# Patient Record
Sex: Male | Born: 1960 | Race: Black or African American | Hispanic: No | Marital: Married | Smoking: Never smoker
Health system: Southern US, Community
[De-identification: ages and names within clinical notes are randomized; demographics above are authoritative.]

## PROBLEM LIST (undated history)

## (undated) DIAGNOSIS — K219 Gastro-esophageal reflux disease without esophagitis: Secondary | ICD-10-CM

## (undated) DIAGNOSIS — J189 Pneumonia, unspecified organism: Secondary | ICD-10-CM

## (undated) DIAGNOSIS — Z8719 Personal history of other diseases of the digestive system: Secondary | ICD-10-CM

## (undated) HISTORY — PX: KNEE SURGERY: SHX244

## (undated) HISTORY — PX: TONSILLECTOMY: SUR1361

---

## 2002-10-17 ENCOUNTER — Encounter: Admission: RE | Admit: 2002-10-17 | Discharge: 2002-10-17 | Payer: Self-pay | Admitting: Gastroenterology

## 2002-10-17 ENCOUNTER — Encounter: Payer: Self-pay | Admitting: Gastroenterology

## 2005-12-20 ENCOUNTER — Encounter: Admission: RE | Admit: 2005-12-20 | Discharge: 2005-12-20 | Payer: Self-pay | Admitting: Family Medicine

## 2008-07-11 ENCOUNTER — Encounter: Admission: RE | Admit: 2008-07-11 | Discharge: 2008-07-11 | Payer: Self-pay | Admitting: Family Medicine

## 2008-11-06 ENCOUNTER — Ambulatory Visit (HOSPITAL_BASED_OUTPATIENT_CLINIC_OR_DEPARTMENT_OTHER): Admission: RE | Admit: 2008-11-06 | Discharge: 2008-11-06 | Payer: Self-pay | Admitting: Orthopedic Surgery

## 2010-10-13 NOTE — Op Note (Signed)
[name], [name] NO.:  [id number]   MEDICAL RECORD NO.:  [id number]          PATIENT TYPE:  AMB   LOCATION:  [facility]                         FACILITY:  [facility]   PHYSICIAN:  Ollen Gross, M.D.    DATE OF BIRTH:  March 14, 1961   DATE OF PROCEDURE:  11/06/2008  DATE OF DISCHARGE:                               OPERATIVE REPORT   PREOPERATIVE DIAGNOSIS:  Right knee chondral defect.   POSTOPERATIVE DIAGNOSIS:  Right knee chondral defect, meniscal tear.   PROCEDURE:  Right knee arthroscopy with medial meniscal debridement and  chondroplasty, medial femoral condyle.   SURGEON:  Ollen Gross, M.D.   ASSISTANT:  None.   ANESTHESIA:  General.   ESTIMATED BLOOD LOSS:  Minimal.   DRAINS:  None.   COMPLICATIONS:  None.   CONDITION:  Stable to recovery.   CLINICAL NOTE:  Donald Miller is a 50 year old male with a several-month history  of significant right knee pain and mechanical symptoms.  He has had  recurrent effusions.  When I initially saw him he had a large effusion.  We did an aspiration and injection which only provided partial temporary  relief.  His exam and history suggest an intra-articular knee process.  MRI was inconclusive.  Given his persistent pain, mechanical symptoms,  and effusion.  He presents now for arthroscopic evaluation and  debridement.   PROCEDURE IN DETAIL:  After successful administration of general  anesthetic, tourniquet placed high on the right thigh and right lower  extremity prepped and draped in the usual sterile fashion.  Standard  superomedial and inferolateral incisions made.  In flow cannula passed  superomedial and camera passed inferolateral.  Arthroscopic  visualization proceeds.  Undersurface of the patella has minimal  chondromalacia and the trochlea has very mild chondromalacia also.  These were both grade 1 at most.  There is no full-thickness chondral  defects or unstable cartilage.  The mediolateral gutters were  visualized  and there are no loose bodies.  Flexion and valgus force applied to the  knee and the medial compartment is entered.  He does has have evidence  of a large chondral defect, central portion of the medial femoral  condyle.  There is unstable cartilage which is essentially flaking off  of the femur.  Size of defect is about 2 x 2 cm.  I debrided this back  to a stable base with a very small area of exposed bone and the rest  stable cartilaginous base and then stabilized the edges of the defect  also.  I probed and it was stable.  There is a tear in the posterior  horn of the medial meniscus.  I used a combination of baskets and 4.2 mm  shaver to debride that.  I also used the ArthroCare to seal it off.  The  intercondylar notch was visualized, ACL was normal.  Lateral compartment  was entered and it looks normal.  I inspected the rest of the joint.  No  other tears or loose bodies are noted.  The arthroscopic equipment is  then removed from the inferior portals which were closed  with  interrupted 4-0 nylon.  20 mL of 0.25% Marcaine with epi injected  through the inflow cannula and then that is removed and that portal  closed with nylon.  A bulky sterile dressing is applied and he is  awakened and transported to recovery in stable condition.      Ollen Gross, M.D.  Electronically Signed     FA/MEDQ  D:  11/06/2008  T:  11/07/2008  Job:  [id number]

## 2011-09-07 ENCOUNTER — Ambulatory Visit
Admission: RE | Admit: 2011-09-07 | Discharge: 2011-09-07 | Disposition: A | Discharge status: Home / Self Care | Payer: 59 | Source: Ambulatory Visit | Attending: Family Medicine | Admitting: Family Medicine

## 2011-09-07 ENCOUNTER — Other Ambulatory Visit: Payer: Self-pay | Admitting: Family Medicine

## 2011-09-07 DIAGNOSIS — J209 Acute bronchitis, unspecified: Secondary | ICD-10-CM

## 2015-09-10 ENCOUNTER — Ambulatory Visit (INDEPENDENT_AMBULATORY_CARE_PROVIDER_SITE_OTHER): Payer: 59 | Admitting: Family Medicine

## 2015-09-10 VITALS — BP 118/72 | HR 67 | Temp 99.6°F | Resp 18 | Ht 73.0 in | Wt 270.2 lb

## 2015-09-10 DIAGNOSIS — A084 Viral intestinal infection, unspecified: Secondary | ICD-10-CM | POA: Diagnosis not present

## 2015-09-10 MED ORDER — PROMETHAZINE HCL 12.5 MG PO TABS: 12.5000 mg | ORAL_TABLET | Freq: Three times a day (TID) | ORAL | Status: DC | PRN

## 2015-09-10 NOTE — Progress Notes (Signed)
This 55 year old gentleman who developed nausea, vomiting, and diarrhea last night. 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  We worked this morning he thought he could go back to work. He was too weak and still vomiting. He's been in the keep ice chips down for last couple hours.  Patient denies fever or lightheadedness. He's had on reconsideration that he may have a low-grade temperature. He has a mild ache around his bellybutton. There's been no blood in stool or emesis.  In general patient's been very healthy. His wife had the same illness several days ago but is now feeling better  Objective: Overweight middle-aged man in no acute distress, seen with wife. BP 118/72 mmHg  Pulse 67  Temp(Src) 99.6 F (37.6 C) (Oral)  Resp 18  Ht 6\' 1"  (1.854 m)  Wt 270 lb 3.2 oz (122.562 kg)  BMI 35.66 kg/m2  SpO2 96% HEENT: Moist mucous membranes, no acute abnormalities noted Neck: Supple Chest: Clear Heart: Regular no murmur Abdomen: Active bowel sounds, no localized tenderness, no guarding or rebound, no HSM, no masses Skin: No rashes   Viral gastroenteritis - Plan: promethazine (PHENERGAN) 12.5 MG tablet  Elvina SidleKurt Bruna Dills, MD

## 2015-09-10 NOTE — Patient Instructions (Addendum)

## 2016-03-23 ENCOUNTER — Emergency Department (HOSPITAL_COMMUNITY)
Admission: EM | Admit: 2016-03-23 | Discharge: 2016-03-23 | Disposition: A | Discharge status: Home / Self Care | Payer: Commercial Managed Care - HMO | Attending: Emergency Medicine | Admitting: Emergency Medicine

## 2016-03-23 ENCOUNTER — Encounter (HOSPITAL_COMMUNITY): Payer: Self-pay | Admitting: Emergency Medicine

## 2016-03-23 ENCOUNTER — Emergency Department (HOSPITAL_COMMUNITY): Payer: Commercial Managed Care - HMO

## 2016-03-23 DIAGNOSIS — J189 Pneumonia, unspecified organism: Secondary | ICD-10-CM | POA: Diagnosis not present

## 2016-03-23 DIAGNOSIS — R05 Cough: Secondary | ICD-10-CM | POA: Diagnosis present

## 2016-03-23 DIAGNOSIS — R600 Localized edema: Secondary | ICD-10-CM | POA: Diagnosis not present

## 2016-03-23 LAB — COMPREHENSIVE METABOLIC PANEL
ALK PHOS: 69 U/L (ref 38–126)
ALT: 19 U/L (ref 17–63)
AST: 58 U/L — ABNORMAL HIGH (ref 15–41)
Albumin: 3.8 g/dL (ref 3.5–5.0)
Anion gap: 6 (ref 5–15)
BILIRUBIN TOTAL: 0.6 mg/dL (ref 0.3–1.2)
BUN: 8 mg/dL (ref 6–20)
CALCIUM: 9.1 mg/dL (ref 8.9–10.3)
CO2: 30 mmol/L (ref 22–32)
CREATININE: 1.23 mg/dL (ref 0.61–1.24)
Chloride: 100 mmol/L — ABNORMAL LOW (ref 101–111)
Glucose, Bld: 105 mg/dL — ABNORMAL HIGH (ref 65–99)
Potassium: 3.7 mmol/L (ref 3.5–5.1)
Sodium: 136 mmol/L (ref 135–145)
TOTAL PROTEIN: 7.2 g/dL (ref 6.5–8.1)

## 2016-03-23 LAB — CBC WITH DIFFERENTIAL/PLATELET
Basophils Absolute: 0 10*3/uL (ref 0.0–0.1)
Basophils Relative: 0 %
EOS PCT: 5 %
Eosinophils Absolute: 0.4 10*3/uL (ref 0.0–0.7)
HEMATOCRIT: 39.6 % (ref 39.0–52.0)
HEMOGLOBIN: 13.6 g/dL (ref 13.0–17.0)
LYMPHS ABS: 3 10*3/uL (ref 0.7–4.0)
Lymphocytes Relative: 34 %
MCH: 28.9 pg (ref 26.0–34.0)
MCHC: 34.3 g/dL (ref 30.0–36.0)
MCV: 84.1 fL (ref 78.0–100.0)
Monocytes Absolute: 0.6 10*3/uL (ref 0.1–1.0)
Monocytes Relative: 7 %
NEUTROS ABS: 4.6 10*3/uL (ref 1.7–7.7)
NEUTROS PCT: 54 %
PLATELETS: 296 10*3/uL (ref 150–400)
RBC: 4.71 MIL/uL (ref 4.22–5.81)
RDW: 12.9 % (ref 11.5–15.5)
WBC: 8.7 10*3/uL (ref 4.0–10.5)

## 2016-03-23 MED ORDER — BENZONATATE 100 MG PO CAPS
200.0000 mg | ORAL_CAPSULE | Freq: Once | ORAL | Status: AC
  Administered 2016-03-23: 200 mg via ORAL
  Filled 2016-03-23: qty 2

## 2016-03-23 MED ORDER — AZITHROMYCIN 250 MG PO TABS
500.0000 mg | ORAL_TABLET | Freq: Once | ORAL | Status: AC
  Administered 2016-03-23: 500 mg via ORAL
  Filled 2016-03-23: qty 2

## 2016-03-23 MED ORDER — AZITHROMYCIN 250 MG PO TABS: 250.0000 mg | ORAL_TABLET | Freq: Every day | ORAL | 0 refills | Status: DC

## 2016-03-23 MED ORDER — IPRATROPIUM-ALBUTEROL 0.5-2.5 (3) MG/3ML IN SOLN
3.0000 mL | Freq: Once | RESPIRATORY_TRACT | Status: AC
  Administered 2016-03-23: 3 mL via RESPIRATORY_TRACT
  Filled 2016-03-23: qty 3

## 2016-03-23 NOTE — ED Provider Notes (Signed)
[facility]-EMERGENCY DEPT Provider Note   CSN: [id number] Arrival date & time: 03/23/16  0224  By signing my name below, I, Christy Sartorius, attest that this documentation has been prepared under the direction and in the presence of Tomasita Crumble, MD . Electronically Signed: Christy Sartorius, Scribe. 03/23/2016. 3:30 AM.  History   Chief Complaint Chief Complaint  Patient presents with  . Cough  . Leg Pain  . Loss of Consciousness   The history is provided by the patient and medical records. No language interpreter was used.  Leg Pain   Associated symptoms include numbness.  Loss of Consciousness   Pertinent negatives include back pain and vomiting.     HPI Comments:  Donald Miller is a 55 y.o. male who presents to the Emergency Department complaining of an episode of syncope yesterday night.  Pt's wife states that he woke her up complaining of a burning sensation in his right upper thigh.  He then toppled out of bed and was unresponsive for 30 seconds then slowly aroused.  Pt reports that yesterday he began having some pains in his right leg.    He denies pain at this time but notes numbness in his anterior thigh, but none along the medial or lateral aspects.  He also reports that he has had a cough for the last week.  He got a flu shot 2 weeks ago and his coworker has had a similar cough.  He has been drinking sufficient water.  Pt denies back pain, changes in diet, vomiting and diarrhea.   History reviewed. No pertinent past medical history.  There are no active problems to display for this patient.   Past Surgical History:  Procedure Laterality Date  . KNEE SURGERY         Home Medications    Prior to Admission medications   Medication Sig Start Date End Date Taking? Authorizing Provider  promethazine (PHENERGAN) 12.5 MG tablet Take 1 tablet (12.5 mg total) by mouth every 8 (eight) hours as needed for nausea or vomiting. Patient not taking: Reported on 03/23/2016  09/10/15   Elvina Sidle, MD    Family History No family history on file.  Social History Social History  Substance Use Topics  . Smoking status: Never Smoker  . Smokeless tobacco: Never Used  . Alcohol use No     Allergies   Review of patient's allergies indicates no known allergies.   Review of Systems Review of Systems  Constitutional: Negative for appetite change.  Respiratory: Positive for cough.   Cardiovascular: Positive for syncope. Negative for leg swelling.  Gastrointestinal: Negative for diarrhea and vomiting.  Musculoskeletal: Positive for myalgias. Negative for back pain.  Skin: Positive for pallor.  Neurological: Positive for syncope and numbness.  Hematological: Bruises/bleeds easily.  All other systems reviewed and are negative.    Physical Exam Updated Vital Signs BP (!) 99/53   Pulse (!) 57   Temp 98.1 F (36.7 C) (Oral)   Resp 16   SpO2 92%   Physical Exam  Constitutional: He is oriented to person, place, and time. Vital signs are normal. He appears well-developed and well-nourished.  Non-toxic appearance. He does not appear ill. No distress.  HENT:  Head: Normocephalic and atraumatic.  Nose: Nose normal.  Mouth/Throat: Oropharynx is clear and moist. No oropharyngeal exudate.  Eyes: Conjunctivae and EOM are normal. Pupils are equal, round, and reactive to light. No scleral icterus.  Neck: Normal range of motion. Neck supple. No tracheal deviation, no edema,  no erythema and normal range of motion present. No thyroid mass and no thyromegaly present.  Cardiovascular: Normal rate, regular rhythm, S1 normal, S2 normal, normal heart sounds, intact distal pulses and normal pulses.  Exam reveals no gallop and no friction rub.   No murmur heard. Pulmonary/Chest: Effort normal and breath sounds normal. No respiratory distress. He has no wheezes. He has no rhonchi. He has no rales.  Abdominal: Soft. Normal appearance and bowel sounds are normal. He  exhibits no distension, no ascites and no mass. There is no hepatosplenomegaly. There is no tenderness. There is no rebound, no guarding and no CVA tenderness.  Musculoskeletal: Normal range of motion. He exhibits edema. He exhibits no tenderness.  Trace BLE edema.  Lymphadenopathy:    He has no cervical adenopathy.  Neurological: He is alert and oriented to person, place, and time. He has normal strength. No cranial nerve deficit or sensory deficit.  Skin: Skin is warm, dry and intact. No petechiae and no rash noted. He is not diaphoretic. No erythema. No pallor.  Nursing note and vitals reviewed.   ED Treatments / Results   DIAGNOSTIC STUDIES:  Oxygen Saturation is 100% on RA, NML by my interpretation.    COORDINATION OF CARE:  3:26 AM Discussed treatment plan with pt at bedside and pt agreed to plan.  Labs (all labs ordered are listed, but only abnormal results are displayed) Labs Reviewed  COMPREHENSIVE METABOLIC PANEL - Abnormal; Notable for the following:       Result Value   Chloride 100 (*)    Glucose, Bld 105 (*)    AST 58 (*)    All other components within normal limits  CBC WITH DIFFERENTIAL/PLATELET    EKG  EKG Interpretation  Date/Time:  Tuesday March 23 2016 03:09:48 EDT Ventricular Rate:  50 PR Interval:    QRS Duration: 86 QT Interval:  406 QTC Calculation: 371 R Axis:   78 Text Interpretation:  Sinus rhythm Baseline wander in lead(s) I III aVL aVF No significant change since last tracing Confirmed by Erroll Luna [phone]) on 03/23/2016 3:39:59 AM       Radiology Dg Chest 2 View  Result Date: 03/23/2016 CLINICAL DATA:  Productive cough for 1 week.  Burning thigh pain. EXAM: CHEST  2 VIEW COMPARISON:  Chest radiograph September 07, 2011 FINDINGS: Cardiac silhouette is upper limits of normal in size. Mediastinal silhouette is nonsuspicious. Mild bronchitic changes. Patchy bibasilar airspace opacities without pleural effusion. No pneumothorax. Soft  tissue planes and included osseous structures are nonsuspicious. IMPRESSION: Bronchitic changes with bibasilar atelectasis, less likely pneumonia. Borderline cardiomegaly. Electronically Signed   By: Awilda Metro M.D.   On: 03/23/2016 04:00    Procedures Procedures (including critical care time)  Medications Ordered in ED Medications  benzonatate (TESSALON) capsule 200 mg (200 mg Oral Given 03/23/16 0312)  ipratropium-albuterol (DUONEB) 0.5-2.5 (3) MG/3ML nebulizer solution 3 mL (3 mLs Nebulization Given 03/23/16 0332)  azithromycin (ZITHROMAX) tablet 500 mg (500 mg Oral Given 03/23/16 0454)     Initial Impression / Assessment and Plan / ED Course  I have reviewed the triage vital signs and the nursing notes.  Pertinent labs & imaging results that were available during my care of the patient were reviewed by me and considered in my medical decision making (see chart for details).  Clinical Course    Patient presents to the ED for coughing and subsequent syncope, likely a vagal episode.  Labs are unremarkable. He was given duoneb and  tessalon pearles for cough.  CXR reveals possible pneumonia, plan to treat in this setting with azithromycin.  He appears well and in NAD. VS remain within his normal limits and he is safe for Dc.  Final Clinical Impressions(s) / ED Diagnoses   Final diagnoses:  Community acquired pneumonia, unspecified laterality    New Prescriptions New Prescriptions   No medications on file     I personally performed the services described in this documentation, which was scribed in my presence. The recorded information has been reviewed and is accurate.       Tomasita CrumbleAdeleke Newman Waren, MD 03/23/16 [phone]

## 2016-03-23 NOTE — ED Triage Notes (Addendum)
Patient reports persistent productive cough for 1 week , right thigh pain " burning " yesterday and brief syncopal episode while coughing yesterday .

## 2017-02-14 DIAGNOSIS — K429 Umbilical hernia without obstruction or gangrene: Secondary | ICD-10-CM | POA: Diagnosis not present

## 2017-03-02 DIAGNOSIS — K429 Umbilical hernia without obstruction or gangrene: Secondary | ICD-10-CM | POA: Diagnosis not present

## 2017-03-02 DIAGNOSIS — M6208 Separation of muscle (nontraumatic), other site: Secondary | ICD-10-CM | POA: Diagnosis not present

## 2017-03-09 DIAGNOSIS — J209 Acute bronchitis, unspecified: Secondary | ICD-10-CM | POA: Diagnosis not present

## 2017-03-16 NOTE — Progress Notes (Signed)
Please place orders in EPIC as patient is being scheduled for a pre-op appointment! Thank you! 

## 2017-03-18 ENCOUNTER — Ambulatory Visit: Payer: Self-pay | Admitting: General Surgery

## 2017-03-18 NOTE — H&P (Signed)
Donald Miller 03/02/2017 10:30 AM Location: [facility] Patient #: [id number] DOB: [birth year] Married / Language: English / Race: Black or African American Male   History of Present Illness Minerva Areola M. Liyat Faulkenberry MD; 03/03/2017 9:21 AM) The patient is a 56 year old male who presents with an abdominal wall hernia. He is referred by Dr Paulino Rily for evaluation of abdominal wall hernia. He states a few weeks ago he had an episode of where he had discomfort around his umbilicus. It was more on the right side. He had no nausea or vomiting with it. It occurred after having a large bowel movement. He states the area just ached for a few days. He noticed nothing protruding through his umbilicus. It was tender where it was sore. He denies any prior abdominal surgery. He generally has a daily bowel movement. He denies any weight loss. He denies smoking. He denies any chest pain, chest pressure, shortness of breath, dyspnea on exertion and orthopnea   Problem List/Past Medical 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  Diagnostic Studies History Christianne Dolin, [place]; 03/02/2017 10:31 AM) Colonoscopy  within last year  Allergies Christianne Dolin, RMA; 03/02/2017 10:32 AM) No Known Allergies 03/02/2017  Medication History Christianne Dolin, RMA; 03/02/2017 10:32 AM) Acetaminophen (Oral as needed) Specific strength unknown - Active. Medications Reconciled  Social History Christianne Dolin, [place]; 03/02/2017 10:31 AM) Alcohol use  Occasional alcohol use. Caffeine use  Carbonated beverages. No drug use  Tobacco use  Never smoker.  Family History Christianne Dolin, [place]; 03/02/2017 10:31 AM) Diabetes Mellitus  Mother.  Other Problems [name], MD; 03/03/2017 9:22  AM) Gastroesophageal Reflux Disease     Review of Systems Christianne Dolin RMA; 03/02/2017 10:31 AM) Skin Not Present- Change in Wart/Mole, Dryness, Hives, Jaundice, New Lesions, Non-Healing Wounds, Rash and Ulcer. HEENT Present- Wears glasses/contact lenses. Not Present- Earache, Hearing Loss, Hoarseness, Nose Bleed, Oral Ulcers, Ringing in the Ears, Seasonal Allergies, Sinus Pain, Sore Throat, Visual Disturbances and Yellow Eyes. Respiratory Not Present- Bloody sputum, Chronic Cough, Difficulty Breathing, Snoring and Wheezing. Breast Not Present- Breast Mass, Breast Pain, Nipple Discharge and Skin Changes. Cardiovascular Not Present- Chest Pain, Difficulty Breathing Lying Down, Leg Cramps, Palpitations, Rapid Heart Rate, Shortness of Breath and Swelling of Extremities. Gastrointestinal Present- Abdominal Pain. Not Present- Bloating, Bloody Stool, Change in Bowel Habits, Chronic diarrhea, Constipation, Difficulty Swallowing, Excessive gas, Gets full quickly at meals, Hemorrhoids, Indigestion, Nausea, Rectal Pain and Vomiting. Male Genitourinary Not Present- Blood in Urine, Change in Urinary Stream, Frequency, Impotence, Nocturia, Painful Urination, Urgency and Urine Leakage. Musculoskeletal Present- Joint Pain. Not Present- Back Pain, Joint Stiffness, Muscle Pain, Muscle Weakness and Swelling of Extremities. Neurological Not Present- Decreased Memory, Fainting, Headaches, Numbness, Seizures, Tingling, Tremor, Trouble walking and Weakness. Psychiatric Not Present- Anxiety, Bipolar, Change in Sleep Pattern, Depression, Fearful and Frequent crying. Endocrine Not Present- Cold Intolerance, Excessive Hunger, Hair Changes, Heat Intolerance, Hot flashes and New Diabetes. Hematology Not Present- Blood Thinners, Easy Bruising, Excessive bleeding, Gland problems, HIV and Persistent Infections.  Vitals Christianne Dolin RMA; 03/02/2017 10:33 AM) 03/02/2017 10:32 AM Weight: 286.4 lb Height: 71in Body  Surface Area: 2.46 m Body Mass Index: 39.94 kg/m  Temp.: 108F  Pulse: 62 (Regular)  BP: 126/84 (Sitting, Left Arm, Standard)       Physical Exam Minerva Areola M.  Treveon Bourcier MD; 03/03/2017 9:20 AM) General Mental Status-Alert. General Appearance-Consistent with stated age. Hydration-Well hydrated. Voice-Normal. Note: morbidly obese; central truncal   Head and Neck Head-normocephalic, atraumatic with no lesions or palpable masses. Trachea-midline. Thyroid Gland Characteristics - normal size and consistency.  Eye Eyeball - Bilateral-Extraocular movements intact. Sclera/Conjunctiva - Bilateral-No scleral icterus.  Chest and Lung Exam Chest and lung exam reveals -quiet, even and easy respiratory effort with no use of accessory muscles and on auscultation, normal breath sounds, no adventitious sounds and normal vocal resonance. Inspection Chest Wall - Normal. Back - normal.  Breast - Did not examine.  Cardiovascular Cardiovascular examination reveals -normal heart sounds, regular rate and rhythm with no murmurs and normal pedal pulses bilaterally.  Abdomen Inspection  Inspection of the abdomen reveals: Note: upper midline to periumbilical diastasis fasical defect at umbilicus - 1.5-2 cm. soft, reducible,. Skin - Scar - no surgical scars. Palpation/Percussion Palpation and Percussion of the abdomen reveal - Soft, Non Tender, No Rebound tenderness, No Rigidity (guarding) and No hepatosplenomegaly. Auscultation Auscultation of the abdomen reveals - Bowel sounds normal.  Peripheral Vascular Upper Extremity Palpation - Pulses bilaterally normal.  Neurologic Neurologic evaluation reveals -alert and oriented x 3 with no impairment of recent or remote memory. Mental Status-Normal.  Neuropsychiatric The patient's mood and affect are described as -normal. Judgment and Insight-insight is appropriate concerning matters relevant to  self.  Musculoskeletal Normal Exam - Left-Upper Extremity Strength Normal and Lower Extremity Strength Normal. Normal Exam - Right-Upper Extremity Strength Normal and Lower Extremity Strength Normal.  Lymphatic Head & Neck  General Head & Neck Lymphatics: Bilateral - Description - Normal. Axillary - Did not examine. Femoral & Inguinal - Did not examine.    Assessment & Plan Minerva Areola(Chikita Dogan M. 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 We discussed the signs and symptoms of incarceration and strangulation. The patient was given educational material. I also drew diagrams.  We discussed nonoperative and operative management. With respect to operative management, we discussed open repair  We discussed the risk and benefits of surgery including but not limited to bleeding, infection, injury to surrounding structures, hernia recurrence, mesh complications, hematoma/seroma formation, blood clot formation, urinary retention, post operative ileus, general anesthesia risk, abdominal pain. We discussed the importance of avoiding heavy lifting and straining for a period of 4- 6 weeks.  The patient has elected to proceed with OPEN REPAIR OF UMBILICAL HERNIA, POSSIBLE MESH. Current Plans Pt Education - Pamphlet Given - Hernia Surgery: discussed with patient and provided information. DIASTASIS RECTI (M62.08) Impression: We discussed abdominal wall diastases. We discussed that it is not a true hernia but rather a thinning of the abdominal wall muscles. He was given Agricultural engineereducational material. I did not recommend surgical correction given his current body weight and since he is asymptomatic. Current Plans Pt Education - EMW_diastasis   Mary Sellaric M. Andrey CampanileWilson, MD, FACS General, Bariatric, & Minimally Invasive [facility] [place]

## 2017-03-24 NOTE — Patient Instructions (Addendum)
Emi Holesnthony B Haeberle  03/24/2017   Your procedure is scheduled on: 04-01-17    Report to [facility] Main  Entrance [place]  elevators to 3rd floor to  Short Stay Center at 1:30PM.    Call this number if you have problems the morning of surgery [phone]    Remember: ONLY 1 PERSON MAY GO WITH YOU TO SHORT STAY TO GET  READY MORNING OF YOUR SURGERY.    Do not eat food After Midnight. YOU MAY HAVE CLEAR LIQUIDS FROM MIDNIGHT UNTIL 930AM DAY OF SURGERY. NOTHING BY MOUTH AFTER 930AM!     Take these medicines the morning of surgery with A SIP OF WATER: TYLENOL IF NEEDED, ZYRTEC                                You may not have any metal on your body including hair pins and              piercings  Do not wear jewelry, make-up, lotions, powders or perfumes, deodorant              Men may shave face and neck.   Do not bring valuables to the hospital. [facility] IS NOT             RESPONSIBLE   FOR VALUABLES.  Contacts, dentures or bridgework may not be worn into surgery.  Leave suitcase in the car. After surgery it may be brought to your room.                Please read over the following fact sheets you were given: _____________________________________________________________________    CLEAR LIQUID DIET   Foods Allowed                                                                     Foods Excluded  Coffee and tea, regular and decaf                             liquids that you cannot  Plain Jell-O in any flavor                                             see through such as: Fruit ices (not with fruit pulp)                                     milk, soups, orange juice  Iced Popsicles                                    All solid food Carbonated beverages, regular and diet  Cranberry, grape and apple juices Sports drinks like Gatorade Lightly seasoned clear broth or consume(fat free) Sugar, honey syrup  Sample  Menu Breakfast                                Lunch                                     Supper Cranberry juice                    Beef broth                            Chicken broth Jell-O                                     Grape juice                           Apple juice Coffee or tea                        Jell-O                                      Popsicle                                                Coffee or tea                        Coffee or tea  _____________________________________________________________________             [facility] - Preparing for Surgery Before surgery, you can play an important role.  Because skin is not sterile, your skin needs to be as free of germs as possible.  You can reduce the number of germs on your skin by washing with CHG (chlorahexidine gluconate) soap before surgery.  CHG is an antiseptic cleaner which kills germs and bonds with the skin to continue killing germs even after washing. Please DO NOT use if you have an allergy to CHG or antibacterial soaps.  If your skin becomes reddened/irritated stop using the CHG and inform your nurse when you arrive at Short Stay. Do not shave (including legs and underarms) for at least 48 hours prior to the first CHG shower.  You may shave your face/neck. Please follow these instructions carefully:  1.  Shower with CHG Soap the night before surgery and the  morning of Surgery.  2.  If you choose to wash your hair, wash your hair first as usual with your  normal  shampoo.  3.  After you shampoo, rinse your hair and body thoroughly to remove the  shampoo.                           4.  Use CHG as you would any other liquid soap.  You can apply chg  directly  to the skin and wash                       Gently with a scrungie or clean washcloth.  5.  Apply the CHG Soap to your body ONLY FROM THE NECK DOWN.   Do not use on face/ open                           Wound or open sores. Avoid contact with eyes, ears mouth  and genitals (private parts).                       Wash face,  Genitals (private parts) with your normal soap.             6.  Wash thoroughly, paying special attention to the area where your surgery  will be performed.  7.  Thoroughly rinse your body with warm water from the neck down.  8.  DO NOT shower/wash with your normal soap after using and rinsing off  the CHG Soap.                9.  Pat yourself dry with a clean towel.            10.  Wear clean pajamas.            11.  Place clean sheets on your bed the night of your first shower and do not  sleep with pets. Day of Surgery : Do not apply any lotions/deodorants the morning of surgery.  Please wear clean clothes to the hospital/surgery center.  FAILURE TO FOLLOW THESE INSTRUCTIONS MAY RESULT IN THE CANCELLATION OF YOUR SURGERY PATIENT SIGNATURE_________________________________  NURSE SIGNATURE__________________________________  ________________________________________________________________________

## 2017-03-29 ENCOUNTER — Encounter (HOSPITAL_COMMUNITY)
Admission: RE | Admit: 2017-03-29 | Discharge: 2017-03-29 | Disposition: A | Discharge status: Home / Self Care | Payer: 59 | Source: Ambulatory Visit | Attending: General Surgery | Admitting: General Surgery

## 2017-03-29 ENCOUNTER — Encounter (INDEPENDENT_AMBULATORY_CARE_PROVIDER_SITE_OTHER): Payer: Self-pay

## 2017-03-29 ENCOUNTER — Encounter (HOSPITAL_COMMUNITY): Payer: Self-pay

## 2017-03-29 DIAGNOSIS — K439 Ventral hernia without obstruction or gangrene: Secondary | ICD-10-CM | POA: Diagnosis present

## 2017-03-29 DIAGNOSIS — Z6841 Body Mass Index (BMI) 40.0 and over, adult: Secondary | ICD-10-CM | POA: Diagnosis not present

## 2017-03-29 DIAGNOSIS — K429 Umbilical hernia without obstruction or gangrene: Secondary | ICD-10-CM | POA: Diagnosis not present

## 2017-03-29 HISTORY — DX: Gastro-esophageal reflux disease without esophagitis: K21.9

## 2017-03-29 HISTORY — DX: Personal history of other diseases of the digestive system: Z87.19

## 2017-03-29 HISTORY — DX: Pneumonia, unspecified organism: J18.9

## 2017-03-29 LAB — CBC
HEMATOCRIT: 41.7 % (ref 39.0–52.0)
HEMOGLOBIN: 14.2 g/dL (ref 13.0–17.0)
MCH: 29 pg (ref 26.0–34.0)
MCHC: 34.1 g/dL (ref 30.0–36.0)
MCV: 85.3 fL (ref 78.0–100.0)
Platelets: 236 10*3/uL (ref 150–400)
RBC: 4.89 MIL/uL (ref 4.22–5.81)
RDW: 13.2 % (ref 11.5–15.5)
WBC: 7.1 10*3/uL (ref 4.0–10.5)

## 2017-03-29 NOTE — Progress Notes (Signed)
   03/29/17 0813  OBSTRUCTIVE SLEEP APNEA  Have you ever been diagnosed with sleep apnea through a sleep study? No  Do you snore loudly (loud enough to be heard through closed doors)?  1  Do you often feel tired, fatigued, or sleepy during the daytime (such as falling asleep during driving or talking to someone)? 0  Has anyone observed you stop breathing during your sleep? 0  Do you have, or are you being treated for high blood pressure? 0  BMI more than 35 kg/m2? 1  Age > 50 (1-yes) 1  Neck circumference greater than:Male 16 inches or larger, Male 17inches or larger? 1  Male Gender (Yes=1) 1  Obstructive Sleep Apnea Score 5

## 2017-03-31 MED ORDER — DEXTROSE 5 % IV SOLN
3.0000 g | INTRAVENOUS | Status: AC
  Administered 2017-04-01: 3 g via INTRAVENOUS
  Filled 2017-03-31: qty 3

## 2017-04-01 ENCOUNTER — Encounter (HOSPITAL_COMMUNITY)
Admission: RE | Disposition: A | Discharge status: Home / Self Care | Payer: Self-pay | Source: Ambulatory Visit | Attending: General Surgery

## 2017-04-01 ENCOUNTER — Ambulatory Visit (HOSPITAL_COMMUNITY): Payer: 59 | Admitting: Anesthesiology

## 2017-04-01 ENCOUNTER — Ambulatory Visit (HOSPITAL_COMMUNITY)
Admission: RE | Admit: 2017-04-01 | Discharge: 2017-04-01 | Disposition: A | Discharge status: Home / Self Care | Payer: 59 | Source: Ambulatory Visit | Attending: General Surgery | Admitting: General Surgery

## 2017-04-01 ENCOUNTER — Encounter (HOSPITAL_COMMUNITY): Payer: Self-pay | Admitting: Anesthesiology

## 2017-04-01 DIAGNOSIS — K429 Umbilical hernia without obstruction or gangrene: Secondary | ICD-10-CM | POA: Insufficient documentation

## 2017-04-01 DIAGNOSIS — Z6841 Body Mass Index (BMI) 40.0 and over, adult: Secondary | ICD-10-CM | POA: Insufficient documentation

## 2017-04-01 DIAGNOSIS — K219 Gastro-esophageal reflux disease without esophagitis: Secondary | ICD-10-CM | POA: Diagnosis not present

## 2017-04-01 HISTORY — PX: UMBILICAL HERNIA REPAIR: SHX196

## 2017-04-01 HISTORY — PX: INSERTION OF MESH: SHX5868

## 2017-04-01 SURGERY — REPAIR, HERNIA, UMBILICAL, ADULT
Anesthesia: General | Site: Abdomen

## 2017-04-01 MED ORDER — CHLORHEXIDINE GLUCONATE CLOTH 2 % EX PADS: 6.0000 | MEDICATED_PAD | Freq: Once | CUTANEOUS | Status: DC

## 2017-04-01 MED ORDER — OXYCODONE HCL 5 MG PO TABS: 5.0000 mg | ORAL_TABLET | Freq: Four times a day (QID) | ORAL | 0 refills | Status: AC | PRN

## 2017-04-01 MED ORDER — PROMETHAZINE HCL 25 MG/ML IJ SOLN: 6.2500 mg | INTRAMUSCULAR | Status: DC | PRN

## 2017-04-01 MED ORDER — PROPOFOL 10 MG/ML IV BOLUS
INTRAVENOUS | Status: AC
  Filled 2017-04-01: qty 20

## 2017-04-01 MED ORDER — ACETAMINOPHEN 500 MG PO TABS
1000.0000 mg | ORAL_TABLET | ORAL | Status: AC
  Administered 2017-04-01: 1000 mg via ORAL
  Filled 2017-04-01: qty 2

## 2017-04-01 MED ORDER — MIDAZOLAM HCL 2 MG/2ML IJ SOLN: 0.5000 mg | Freq: Once | INTRAMUSCULAR | Status: DC | PRN

## 2017-04-01 MED ORDER — SUCCINYLCHOLINE CHLORIDE 200 MG/10ML IV SOSY
PREFILLED_SYRINGE | INTRAVENOUS | Status: AC
  Filled 2017-04-01: qty 10

## 2017-04-01 MED ORDER — PROPOFOL 10 MG/ML IV BOLUS
INTRAVENOUS | Status: DC | PRN
  Administered 2017-04-01: 200 mg via INTRAVENOUS

## 2017-04-01 MED ORDER — PHENYLEPHRINE 40 MCG/ML (10ML) SYRINGE FOR IV PUSH (FOR BLOOD PRESSURE SUPPORT)
PREFILLED_SYRINGE | INTRAVENOUS | Status: AC
Start: 2017-04-01 — End: ?
  Filled 2017-04-01: qty 10

## 2017-04-01 MED ORDER — MEPERIDINE HCL 50 MG/ML IJ SOLN: 6.2500 mg | INTRAMUSCULAR | Status: DC | PRN

## 2017-04-01 MED ORDER — LIDOCAINE 2% (20 MG/ML) 5 ML SYRINGE
INTRAMUSCULAR | Status: DC | PRN
  Administered 2017-04-01: 1 mg/kg/h via INTRAVENOUS

## 2017-04-01 MED ORDER — SUCCINYLCHOLINE CHLORIDE 200 MG/10ML IV SOSY
PREFILLED_SYRINGE | INTRAVENOUS | Status: DC | PRN
  Administered 2017-04-01: 140 mg via INTRAVENOUS

## 2017-04-01 MED ORDER — SUGAMMADEX SODIUM 200 MG/2ML IV SOLN
INTRAVENOUS | Status: DC | PRN
  Administered 2017-04-01: 300 mg via INTRAVENOUS

## 2017-04-01 MED ORDER — ROCURONIUM BROMIDE 50 MG/5ML IV SOSY
PREFILLED_SYRINGE | INTRAVENOUS | Status: AC
  Filled 2017-04-01: qty 5

## 2017-04-01 MED ORDER — LIDOCAINE 2% (20 MG/ML) 5 ML SYRINGE
INTRAMUSCULAR | Status: DC | PRN
Start: 2017-04-01 — End: 2017-04-01
  Administered 2017-04-01: 100 mg via INTRAVENOUS

## 2017-04-01 MED ORDER — GABAPENTIN 300 MG PO CAPS
300.0000 mg | ORAL_CAPSULE | ORAL | Status: AC
  Administered 2017-04-01: 300 mg via ORAL
  Filled 2017-04-01: qty 1

## 2017-04-01 MED ORDER — MIDAZOLAM HCL 2 MG/2ML IJ SOLN
INTRAMUSCULAR | Status: DC | PRN
  Administered 2017-04-01: 2 mg via INTRAVENOUS

## 2017-04-01 MED ORDER — DEXAMETHASONE SODIUM PHOSPHATE 10 MG/ML IJ SOLN
INTRAMUSCULAR | Status: DC | PRN
  Administered 2017-04-01: 10 mg via INTRAVENOUS

## 2017-04-01 MED ORDER — BUPIVACAINE-EPINEPHRINE 0.25% -1:200000 IJ SOLN
INTRAMUSCULAR | Status: AC
  Filled 2017-04-01: qty 1

## 2017-04-01 MED ORDER — SUGAMMADEX SODIUM 500 MG/5ML IV SOLN
INTRAVENOUS | Status: AC
  Filled 2017-04-01: qty 5

## 2017-04-01 MED ORDER — ONDANSETRON HCL 4 MG/2ML IJ SOLN
INTRAMUSCULAR | Status: AC
  Filled 2017-04-01: qty 2

## 2017-04-01 MED ORDER — LIDOCAINE 2% (20 MG/ML) 5 ML SYRINGE
INTRAMUSCULAR | Status: AC
  Filled 2017-04-01: qty 10

## 2017-04-01 MED ORDER — ROCURONIUM BROMIDE 10 MG/ML (PF) SYRINGE
PREFILLED_SYRINGE | INTRAVENOUS | Status: DC | PRN
  Administered 2017-04-01: 50 mg via INTRAVENOUS

## 2017-04-01 MED ORDER — LACTATED RINGERS IV SOLN
INTRAVENOUS | Status: DC
  Administered 2017-04-01 (×2): via INTRAVENOUS

## 2017-04-01 MED ORDER — FENTANYL CITRATE (PF) 250 MCG/5ML IJ SOLN
INTRAMUSCULAR | Status: AC
  Filled 2017-04-01: qty 5

## 2017-04-01 MED ORDER — LIDOCAINE 2% (20 MG/ML) 5 ML SYRINGE
INTRAMUSCULAR | Status: AC
  Filled 2017-04-01: qty 5

## 2017-04-01 MED ORDER — MIDAZOLAM HCL 2 MG/2ML IJ SOLN
INTRAMUSCULAR | Status: AC
  Filled 2017-04-01: qty 2

## 2017-04-01 MED ORDER — OXYCODONE HCL 5 MG PO TABS
5.0000 mg | ORAL_TABLET | ORAL | Status: DC | PRN
  Administered 2017-04-01: 5 mg via ORAL
  Filled 2017-04-01: qty 1

## 2017-04-01 MED ORDER — HYDROMORPHONE HCL 1 MG/ML IJ SOLN: 0.2500 mg | INTRAMUSCULAR | Status: DC | PRN

## 2017-04-01 MED ORDER — KETOROLAC TROMETHAMINE 30 MG/ML IJ SOLN
INTRAMUSCULAR | Status: DC | PRN
  Administered 2017-04-01: 30 mg via INTRAVENOUS

## 2017-04-01 MED ORDER — DEXAMETHASONE SODIUM PHOSPHATE 10 MG/ML IJ SOLN
INTRAMUSCULAR | Status: AC
  Filled 2017-04-01: qty 1

## 2017-04-01 MED ORDER — PHENYLEPHRINE 40 MCG/ML (10ML) SYRINGE FOR IV PUSH (FOR BLOOD PRESSURE SUPPORT)
PREFILLED_SYRINGE | INTRAVENOUS | Status: DC | PRN
  Administered 2017-04-01: 80 ug via INTRAVENOUS

## 2017-04-01 MED ORDER — ONDANSETRON HCL 4 MG/2ML IJ SOLN
INTRAMUSCULAR | Status: DC | PRN
  Administered 2017-04-01: 4 mg via INTRAVENOUS

## 2017-04-01 MED ORDER — ROCURONIUM BROMIDE 10 MG/ML (PF) SYRINGE: PREFILLED_SYRINGE | INTRAVENOUS | Status: DC | PRN

## 2017-04-01 MED ORDER — FENTANYL CITRATE (PF) 100 MCG/2ML IJ SOLN
INTRAMUSCULAR | Status: DC | PRN
  Administered 2017-04-01: 50 ug via INTRAVENOUS
  Administered 2017-04-01: 150 ug via INTRAVENOUS

## 2017-04-01 MED ORDER — BUPIVACAINE-EPINEPHRINE 0.25% -1:200000 IJ SOLN
INTRAMUSCULAR | Status: DC | PRN
  Administered 2017-04-01: 20 mL

## 2017-04-01 SURGICAL SUPPLY — 42 items
BENZOIN TINCTURE PRP APPL 2/3 (GAUZE/BANDAGES/DRESSINGS) ×3 IMPLANT
CLOSURE WOUND 1/2 X4 (GAUZE/BANDAGES/DRESSINGS) ×1
COVER SURGICAL LIGHT HANDLE (MISCELLANEOUS) ×3 IMPLANT
DECANTER SPIKE VIAL GLASS SM (MISCELLANEOUS) ×3 IMPLANT
DRAIN CHANNEL 19F RND (DRAIN) IMPLANT
DRAPE LAPAROSCOPIC ABDOMINAL (DRAPES) IMPLANT
DRSG TEGADERM 4X4.75 (GAUZE/BANDAGES/DRESSINGS) ×3 IMPLANT
ELECT REM PT RETURN 15FT ADLT (MISCELLANEOUS) ×3 IMPLANT
EVACUATOR SILICONE 100CC (DRAIN) IMPLANT
GAUZE SPONGE 2X2 8PLY STRL LF (GAUZE/BANDAGES/DRESSINGS) ×1 IMPLANT
GAUZE SPONGE 4X4 12PLY STRL (GAUZE/BANDAGES/DRESSINGS) IMPLANT
GLOVE BIO SURGEON STRL SZ7 (GLOVE) ×3 IMPLANT
GLOVE BIO SURGEON STRL SZ7.5 (GLOVE) ×3 IMPLANT
GLOVE INDICATOR 8.0 STRL GRN (GLOVE) ×3 IMPLANT
GOWN STRL REUS W/TWL XL LVL3 (GOWN DISPOSABLE) ×9 IMPLANT
HOVERMATT SINGLE USE (MISCELLANEOUS) ×3 IMPLANT
KIT BASIN OR (CUSTOM PROCEDURE TRAY) ×3 IMPLANT
MESH VENTRALEX ST 8CM LRG (Mesh General) ×3 IMPLANT
NEEDLE HYPO 22GX1.5 SAFETY (NEEDLE) ×3 IMPLANT
NS IRRIG 1000ML POUR BTL (IV SOLUTION) ×3 IMPLANT
PACK GENERAL/GYN (CUSTOM PROCEDURE TRAY) ×3 IMPLANT
SPONGE GAUZE 2X2 STER 10/PKG (GAUZE/BANDAGES/DRESSINGS) ×2
SPONGE LAP 18X18 X RAY DECT (DISPOSABLE) IMPLANT
STAPLER VISISTAT 35W (STAPLE) IMPLANT
STRIP CLOSURE SKIN 1/2X4 (GAUZE/BANDAGES/DRESSINGS) ×2 IMPLANT
SUT ETHILON 2 0 PS N (SUTURE) IMPLANT
SUT MNCRL AB 4-0 PS2 18 (SUTURE) ×3 IMPLANT
SUT NOVA 0 T19/GS 22DT (SUTURE) IMPLANT
SUT NOVA 1 T20/GS 25DT (SUTURE) IMPLANT
SUT NOVA NAB DX-16 0-1 5-0 T12 (SUTURE) ×6 IMPLANT
SUT PDS AB 1 TP1 96 (SUTURE) IMPLANT
SUT PROLENE 0 CT 1 CR/8 (SUTURE) IMPLANT
SUT VIC AB 2-0 SH 27 (SUTURE) ×2
SUT VIC AB 2-0 SH 27X BRD (SUTURE) ×1 IMPLANT
SUT VIC AB 3-0 SH 18 (SUTURE) ×3 IMPLANT
SUT VIC AB 3-0 SH 27 (SUTURE) ×2
SUT VIC AB 3-0 SH 27X BRD (SUTURE) ×1 IMPLANT
SYR CONTROL 10ML LL (SYRINGE) ×3 IMPLANT
TOWEL OR 17X26 10 PK STRL BLUE (TOWEL DISPOSABLE) ×3 IMPLANT
TOWEL OR NON WOVEN STRL DISP B (DISPOSABLE) ×3 IMPLANT
TRAY FOLEY W/METER SILVER 14FR (SET/KITS/TRAYS/PACK) IMPLANT
TRAY FOLEY W/METER SILVER 16FR (SET/KITS/TRAYS/PACK) IMPLANT

## 2017-04-01 NOTE — Anesthesia Postprocedure Evaluation (Signed)
Anesthesia Post Note  Patient: Donald Miller  Procedure(s) Performed: OPEN REPAIR UMBILICAL HERNIA REPAIR WITH MESH (N/A Abdomen) INSERTION OF MESH (N/A Abdomen)     Patient location during evaluation: PACU Anesthesia Type: General Level of consciousness: awake and alert, oriented and patient cooperative Pain management: pain level controlled Vital Signs Assessment: post-procedure vital signs reviewed and stable Respiratory status: spontaneous breathing, nonlabored ventilation and respiratory function stable Cardiovascular status: blood pressure returned to baseline and stable Postop Assessment: no apparent nausea or vomiting and adequate PO intake Anesthetic complications: no    Last Vitals:  Vitals:   04/01/17 1000 04/01/17 1015  BP: 131/77 134/86  Pulse: (!) 59 73  Resp: 13 19  Temp:  36.4 C  SpO2: 95% 94%    Last Pain:  Vitals:   04/01/17 1015  TempSrc:   PainSc: 3                  Eneida Evers,E. Agripina Guyette

## 2017-04-01 NOTE — Op Note (Signed)
Donald Miller [id number] 03-Feb-1961 04/01/2017   Open Umbilical Herniorrhaphy with Mesh Procedure Note  Indications: Symptomatic umbilical hernia   Pre-operative Diagnosis: umbilical hernia   Post-operative Diagnosis: umbilical hernia   Surgeon: Mary SellaEric M. Andrey CampanileWilson, MD FACS   Assistants: none   Anesthesia: General endotracheal anesthesia and Local anesthesia 0.25%marcaine with epi   Procedure Details  The patient was seen in the Holding Room. The risks, benefits, complications, treatment options, and expected outcomes were discussed with the patient. The possibilities of reaction to medication, pulmonary aspiration, perforation of viscus, bleeding, recurrent infection, hernia recurrence, the need for additional procedures, failure to diagnose a condition, and creating a complication requiring transfusion or operation were discussed with the patient. The patient concurred with the proposed plan, giving informed consent. The site of surgery properly noted/marked.   The patient [facility], identified as Donald HolesAnthony B Thurmond  and the procedure verified as Umbilical Herniorrhaphy with possible mesh. A Time Out was held and the above information confirmed. The patient received IV antibiotics and oral ERAS medications prior to the procedure.   The patient was placed supine. After establishing general anesthesia, the abdomen was prepped and draped in standard fashion. 0.250% Marcaine with epinephrine was used to anesthetize the skin. A curvilinear infraumbilical incision was created. He had a flat umbilicus. Dissection was carried down to the fascia and the skin and subcutaneous tissue was lifted off of the fascia exposing the fascial defect.  Small bowel was visualized and inspected and no evidence of injury noted. Intact fascia was identified circumferentially around the defect. Skin and soft tissue was mobilized from the surface of the fascia in a circumferential manner.  A finger sweep was performed underneath the fascia to ensure there were no adhesions to the anterior abdominal wall. A Bard VentralightST 8.4 cm hernia patch was then placed thru the defect. The tabs were lifted to ensure that the mesh was flush against the abdominal wall. I then ran my finger around the inside of the defect to ensure nothing was trapped between the mesh and the abdominal wall. Two  1-0novafil sutures were used to secure the base of each tab to the fascia and then the excess tab was trimmed at the fascial level. The fascia was then closed primarily over the mesh with 5 interrupted 1-0-novafil sutures. The cavity was irrigated and additional local was infiltrated in the subcutaneous tissue and fascia. The umbilical stalk was then tacked back down to the fascia with two 3-0 vicryl sutures. Hemostasis was confirmed. The soft tissue was irrigated and closed in layers with inverted interrupted 3-0 vicryl sutures for the deep dermis. The skin incision was closed with a 4-0 monocryl subcuticular closure. Steri-Strips followed by a 4x4 gauze and a tegaderm were applied at the end of the operation.   Instrument, sponge, and needle counts were correct prior to closure and at the conclusion of the case.   Findings:  A 2 cm fascial defect; mesh placed intraabdominally under fascia  Estimated Blood Loss: Minimal   Drains: none   Implants: bard ventralightST 8.4cm hernia patch   Complications: None; patient tolerated the procedure well.   Disposition: PACU - hemodynamically stable.   Condition: stable  Mary SellaEric M. Andrey CampanileWilson, MD, FACS General, Bariatric, & Minimally Invasive Surgery [facility], [place]

## 2017-04-01 NOTE — Transfer of Care (Signed)
Immediate Anesthesia Transfer of Care Note  Patient: Donald Miller  Procedure(s) Performed: OPEN REPAIR UMBILICAL HERNIA REPAIR WITH MESH (N/A Abdomen) INSERTION OF MESH (N/A Abdomen)  Patient Location: PACU  Anesthesia Type:General  Level of Consciousness: awake  Airway & Oxygen Therapy: Patient Spontanous Breathing and Patient connected to face mask oxygen  Post-op Assessment: Report given to RN and Post -op Vital signs reviewed and stable  Post vital signs: Reviewed and stable  Last Vitals:  Vitals:   04/01/17 0553  BP: 123/90  Pulse: 63  Resp: 16  Temp: 37 C  SpO2: 98%    Last Pain:  Vitals:   04/01/17 0553  TempSrc: Oral      Patients Stated Pain Goal: 4 (04/01/17 0630)  Complications: No apparent anesthesia complications

## 2017-04-01 NOTE — Discharge Instructions (Signed)
General Anesthesia, Adult, Care After These instructions provide you with information about caring for yourself after your procedure. Your health care provider may also give you more specific instructions. Your treatment has been planned according to current medical practices, but problems sometimes occur. Call your health care provider if you have any problems or questions after your procedure. What can I expect after the procedure? After the procedure, it is common to have:  Vomiting.  A sore throat.  Mental slowness.  It is common to feel:  Nauseous.  Cold or shivery.  Sleepy.  Tired.  Sore or achy, even in parts of your body where you did not have surgery.  Follow these instructions at home: For at least 24 hours after the procedure:  Do not: ? Participate in activities where you could fall or become injured. ? Drive. ? Use heavy machinery. ? Drink alcohol. ? Take sleeping pills or medicines that cause drowsiness. ? Make important decisions or sign legal documents. ? Take care of children on your own.  Rest. Eating and drinking  If you vomit, drink water, juice, or soup when you can drink without vomiting.  Drink enough fluid to keep your urine clear or pale yellow.  Make sure you have little or no nausea before eating solid foods.  Follow the diet recommended by your health care provider. General instructions  Have a responsible adult stay with you until you are awake and alert.  Return to your normal activities as told by your health care provider. Ask your health care provider what activities are safe for you.  Take over-the-counter and prescription medicines only as told by your health care provider.  If you smoke, do not smoke without supervision.  Keep all follow-up visits as told by your health care provider. This is important. Contact a health care provider if:  You continue to have nausea or vomiting at home, and medicines are not helpful.  You  cannot drink fluids or start eating again.  You cannot urinate after 8-12 hours.  You develop a skin rash.  You have fever.  You have increasing redness at the site of your procedure. Get help right away if:  You have difficulty breathing.  You have chest pain.  You have unexpected bleeding.  You feel that you are having a life-threatening or urgent problem. This information is not intended to replace advice given to you by your health care provider. Make sure you discuss any questions you have with your health care provider. Document Released: 08/23/2000 Document Revised: 10/20/2015 Document Reviewed: 05/01/2015 Elsevier Interactive Patient Education  2018 [employer]. [facility], [place]  UMBILICAL OR INGUINAL HERNIA REPAIR: POST OP INSTRUCTIONS  Always review your discharge instruction sheet given to you by the facility where your surgery was performed. IF YOU HAVE DISABILITY OR FAMILY LEAVE FORMS, YOU MUST BRING THEM TO THE OFFICE FOR PROCESSING.   DO NOT GIVE THEM TO YOUR DOCTOR.  1. A  prescription for pain medication may be given to you upon discharge. take acetaminophen (Tylenol) &/or ibuprofen (Advil) first for pain control. You can alternate tylenol with ibuprofen. Follow package directions. If you still have pain, then you may take your prescription pain medication (oxycodone). Take your usually prescribed medications unless otherwise directed. 2. If you need a refill on your pain medication, please contact your pharmacy.  They will contact our office to request authorization. Prescriptions will not be filled after 5 pm or on week-ends. 3. You should follow a light  diet the first 24 hours after arrival home, such as soup and crackers, etc.  Be sure to include lots of fluids daily.  Resume your normal diet the day after surgery. 4. Most patients will experience some swelling and bruising around the umbilicus or in the groin and scrotum.  Ice packs and  reclining will help.  Swelling and bruising can take several days to resolve.  5. It is common to experience some constipation if taking pain medication after surgery.  Increasing fluid intake and taking a stool softener (such as Colace) will usually help or prevent this problem from occurring.  A mild laxative (Milk of Magnesia or Miralax) should be taken according to package directions if there are no bowel movements after 48 hours. 6. Unless discharge instructions indicate otherwise, you may remove your bandages 48 hours after surgery, and you may shower at that time.  You may have steri-strips (small skin tapes) in place directly over the incision.  These strips should be left on the skin for 7-10 days.  7. ACTIVITIES:  You may resume regular (light) daily activities beginning the next day--such as daily self-care, walking, climbing stairs--gradually increasing activities as tolerated.  You may have sexual intercourse when it is comfortable.  Refrain from any heavy lifting or straining until approved by your doctor. a. You may drive when you are no longer taking prescription pain medication, you can comfortably wear a seatbelt, and you can safely maneuver your car and apply brakes. b. RETURN TO WORK:  8. You should see your doctor in the office for a follow-up appointment approximately 2-3 weeks after your surgery.  Make sure that you call for this appointment within a day or two after you arrive home to insure a convenient appointment time. 9. OTHER INSTRUCTIONS: DO NOT LIFT/PUSH/PULL ANYTHING GREATER THAN 10 LBS FOR 6 WEEKS    WHEN TO CALL YOUR DOCTOR: 1. Fever over 101.0 2. Inability to urinate 3. Nausea and/or vomiting 4. Extreme swelling or bruising 5. Continued bleeding from incision. 6. Increased pain, redness, or drainage from the incision  The clinic staff is available to answer your questions during regular business hours.  Please dont hesitate to call and ask to speak to one of the  nurses for clinical concerns.  If you have a medical emergency, go to the nearest emergency room or call 911.  A surgeon from [facility] is always on call at the hospital   [address], [address], [place], [place]  [address] ?  [address] [address], [place], [place]   [address] [phone] ? [phone] ? FAX [phone] Web site: [email or web address]

## 2017-04-01 NOTE — Interval H&P Note (Signed)
History and Physical Interval Note:  04/01/2017 7:53 AM  Donald Miller  has presented today for surgery, with the diagnosis of UMBILICAL HERNIA  The various methods of treatment have been discussed with the patient and family. After consideration of risks, benefits and other options for treatment, the patient has consented to  Procedure(s): OPEN REPAIR UMBILICAL HERNIA REPAIR WITH MESH (N/A) INSERTION OF MESH (N/A) as a surgical intervention .  The patient's history has been reviewed, patient examined, no change in status, stable for surgery.  I have reviewed the patient's chart and labs.  Questions were answered to the patient's satisfaction.    Mary SellaEric M. Andrey CampanileWilson, MD, FACS General, Bariatric, & Minimally Invasive Surgery [facility], [place]  [facility],Laylia Mui M

## 2017-04-01 NOTE — Anesthesia Procedure Notes (Signed)
Procedure Name: Intubation Date/Time: 04/01/2017 8:13 AM Performed by: Leroy LibmanEARDON, Tenaya Hilyer L Patient Re-evaluated:Patient Re-evaluated prior to induction Oxygen Delivery Method: Circle system utilized Preoxygenation: Pre-oxygenation with 100% oxygen Induction Type: IV induction Ventilation: Mask ventilation without difficulty and Oral airway inserted - appropriate to patient size Laryngoscope Size: Miller and 3 Grade View: Grade I Tube type: Oral Tube size: 8.0 mm Number of attempts: 1 Airway Equipment and Method: Stylet Placement Confirmation: ETT inserted through vocal cords under direct vision,  positive ETCO2 and breath sounds checked- equal and bilateral Secured at: 23 cm Tube secured with: Tape Dental Injury: Teeth and Oropharynx as per pre-operative assessment

## 2017-04-01 NOTE — H&P (View-Only) (Signed)
Donald Miller 03/02/2017 10:30 AM Location: [facility] Patient #: [id number] DOB: [birth year] Married / Language: English / Race: Black or African American Male   History of Present Illness Donald Areola M. Capone Schwinn MD; 03/03/2017 9:21 AM) The patient is a 56 year old male who presents with an abdominal wall hernia. He is referred by Dr Donald Miller for evaluation of abdominal wall hernia. He states a few weeks ago he had an episode of where he had discomfort around his umbilicus. It was more on the right side. He had no nausea or vomiting with it. It occurred after having a large bowel movement. He states the area just ached for a few days. He noticed nothing protruding through his umbilicus. It was tender where it was sore. He denies any prior abdominal surgery. He generally has a daily bowel movement. He denies any weight loss. He denies smoking. He denies any chest pain, chest pressure, shortness of breath, dyspnea on exertion and orthopnea   Problem List/Past Medical Donald Areola M. Andrey Campanile, MD; 03/03/2017 9:22 AM) UMBILICAL HERNIA WITHOUT OBSTRUCTION OR GANGRENE (K42.9)  DIASTASIS RECTI (M62.08)   Past Surgical History Donald Miller; 03/02/2017 10:31 AM) Colon Polyp Removal - Colonoscopy  Knee Surgery  Right.  Diagnostic Studies History Donald Miller; 03/02/2017 10:31 AM) Colonoscopy  within last year  Allergies Donald Miller; 03/02/2017 10:32 AM) No Known Allergies 03/02/2017  Medication History Donald Miller; 03/02/2017 10:32 AM) Acetaminophen (Oral as needed) Specific strength unknown - Active. Medications Reconciled  Social History Donald Miller; 03/02/2017 10:31 AM) Alcohol use  Occasional alcohol use. Caffeine use  Carbonated beverages. No drug use  Tobacco use  Never smoker.  Family History Donald Miller; 03/02/2017 10:31 AM) Diabetes Mellitus  Mother.  Other Problems Donald Areola M. Andrey Campanile, MD; 03/03/2017 9:22  AM) Gastroesophageal Reflux Disease     Review of Systems Donald Miller; 03/02/2017 10:31 AM) Skin Not Present- Change in Wart/Mole, Dryness, Hives, Jaundice, New Lesions, Non-Healing Wounds, Rash and Ulcer. HEENT Present- Wears glasses/contact lenses. Not Present- Earache, Hearing Loss, Hoarseness, Nose Bleed, Oral Ulcers, Ringing in the Ears, Seasonal Allergies, Sinus Pain, Sore Throat, Visual Disturbances and Yellow Eyes. Respiratory Not Present- Bloody sputum, Chronic Cough, Difficulty Breathing, Snoring and Wheezing. Breast Not Present- Breast Mass, Breast Pain, Nipple Discharge and Skin Changes. Cardiovascular Not Present- Chest Pain, Difficulty Breathing Lying Down, Leg Cramps, Palpitations, Rapid Heart Rate, Shortness of Breath and Swelling of Extremities. Gastrointestinal Present- Abdominal Pain. Not Present- Bloating, Bloody Stool, Change in Bowel Habits, Chronic diarrhea, Constipation, Difficulty Swallowing, Excessive gas, Gets full quickly at meals, Hemorrhoids, Indigestion, Nausea, Rectal Pain and Vomiting. Male Genitourinary Not Present- Blood in Urine, Change in Urinary Stream, Frequency, Impotence, Nocturia, Painful Urination, Urgency and Urine Leakage. Musculoskeletal Present- Joint Pain. Not Present- Back Pain, Joint Stiffness, Muscle Pain, Muscle Weakness and Swelling of Extremities. Neurological Not Present- Decreased Memory, Fainting, Headaches, Numbness, Seizures, Tingling, Tremor, Trouble walking and Weakness. Psychiatric Not Present- Anxiety, Bipolar, Change in Sleep Pattern, Depression, Fearful and Frequent crying. Endocrine Not Present- Cold Intolerance, Excessive Hunger, Hair Changes, Heat Intolerance, Hot flashes and New Diabetes. Hematology Not Present- Blood Thinners, Easy Bruising, Excessive bleeding, Gland problems, HIV and Persistent Infections.  Vitals Donald Miller; 03/02/2017 10:33 AM) 03/02/2017 10:32 AM Weight: 286.4 lb Height: 71in Body  Surface Area: 2.46 m Body Mass Index: 39.94 kg/m  Temp.: 108F  Pulse: 62 (Regular)  BP: 126/84 (Sitting, Left Arm, Standard)       Physical Exam Donald Areola M.  Donald Rosner MD; 03/03/2017 9:20 AM) General Mental Status-Alert. General Appearance-Consistent with stated age. Hydration-Well hydrated. Voice-Normal. Note: morbidly obese; central truncal   Head and Neck Head-normocephalic, atraumatic with no lesions or palpable masses. Trachea-midline. Thyroid Gland Characteristics - normal size and consistency.  Eye Eyeball - Bilateral-Extraocular movements intact. Sclera/Conjunctiva - Bilateral-No scleral icterus.  Chest and Lung Exam Chest and lung exam reveals -quiet, even and easy respiratory effort with no use of accessory muscles and on auscultation, normal breath sounds, no adventitious sounds and normal vocal resonance. Inspection Chest Wall - Normal. Back - normal.  Breast - Did not examine.  Cardiovascular Cardiovascular examination reveals -normal heart sounds, regular rate and rhythm with no murmurs and normal pedal pulses bilaterally.  Abdomen Inspection  Inspection of the abdomen reveals: Note: upper midline to periumbilical diastasis fasical defect at umbilicus - 1.5-2 cm. soft, reducible,. Skin - Scar - no surgical scars. Palpation/Percussion Palpation and Percussion of the abdomen reveal - Soft, Non Tender, No Rebound tenderness, No Rigidity (guarding) and No hepatosplenomegaly. Auscultation Auscultation of the abdomen reveals - Bowel sounds normal.  Peripheral Vascular Upper Extremity Palpation - Pulses bilaterally normal.  Neurologic Neurologic evaluation reveals -alert and oriented x 3 with no impairment of recent or remote memory. Mental Status-Normal.  Neuropsychiatric The patient's mood and affect are described as -normal. Judgment and Insight-insight is appropriate concerning matters relevant to  self.  Musculoskeletal Normal Exam - Left-Upper Extremity Strength Normal and Lower Extremity Strength Normal. Normal Exam - Right-Upper Extremity Strength Normal and Lower Extremity Strength Normal.  Lymphatic Head & Neck  General Head & Neck Lymphatics: Bilateral - Description - Normal. Axillary - Did not examine. Femoral & Inguinal - Did not examine.    Assessment & Plan Donald Areola(Mykale Gandolfo M. Carlester Kasparek MD; 03/03/2017 9:22 AM) UMBILICAL HERNIA WITHOUT OBSTRUCTION OR GANGRENE (K42.9) Impression: We discussed the etiology of umbilical hernias. We discussed the signs and symptoms of incarceration and strangulation. The patient was given educational material. I also drew diagrams.  We discussed nonoperative and operative management. With respect to operative management, we discussed open repair  We discussed the risk and benefits of surgery including but not limited to bleeding, infection, injury to surrounding structures, hernia recurrence, mesh complications, hematoma/seroma formation, blood clot formation, urinary retention, post operative ileus, general anesthesia risk, abdominal pain. We discussed the importance of avoiding heavy lifting and straining for a period of 4- 6 weeks.  The patient has elected to proceed with OPEN REPAIR OF UMBILICAL HERNIA, POSSIBLE MESH. Current Plans Pt Education - Pamphlet Given - Hernia Surgery: discussed with patient and provided information. DIASTASIS RECTI (M62.08) Impression: We discussed abdominal wall diastases. We discussed that it is not a true hernia but rather a thinning of the abdominal wall muscles. He was given Agricultural engineereducational material. I did not recommend surgical correction given his current body weight and since he is asymptomatic. Current Plans Pt Education - EMW_diastasis   Mary Sellaric M. Andrey CampanileWilson, MD, FACS General, Bariatric, & Minimally In[facility] [place]

## 2017-04-01 NOTE — Anesthesia Preprocedure Evaluation (Addendum)
Anesthesia Evaluation  Patient identified by MRN, date of birth, ID band Patient awake    Reviewed: Allergy & Precautions, NPO status , Patient's Chart, lab work & pertinent test results  History of Anesthesia Complications Negative for: history of anesthetic complications  Airway Mallampati: II  TM Distance: >3 FB Neck ROM: Full    Dental  (+) Dental Advisory Given   Pulmonary neg pulmonary ROS,    breath sounds clear to auscultation       Cardiovascular (-) anginanegative cardio ROS   Rhythm:Regular Rate:Normal     Neuro/Psych negative neurological ROS     GI/Hepatic Neg liver ROS, GERD  Controlled,  Endo/Other  Morbid obesity  Renal/GU negative Renal ROS     Musculoskeletal   Abdominal (+) + obese,   Peds  Hematology negative hematology ROS (+)   Anesthesia Other Findings   Reproductive/Obstetrics                            Anesthesia Physical Anesthesia Plan  ASA: II  Anesthesia Plan: General   Post-op Pain Management:    Induction: Intravenous  PONV Risk Score and Plan: 3 and Ondansetron, Dexamethasone and Midazolam  Airway Management Planned: Oral ETT  Additional Equipment:   Intra-op Plan:   Post-operative Plan: Extubation in OR  Informed Consent: I have reviewed the patients History and Physical, chart, labs and discussed the procedure including the risks, benefits and alternatives for the proposed anesthesia with the patient or authorized representative who has indicated his/her understanding and acceptance.   Dental advisory given  Plan Discussed with: CRNA and Surgeon  Anesthesia Plan Comments: (Plan routine monitors, GETA)        Anesthesia Quick Evaluation

## 2017-10-03 DIAGNOSIS — J069 Acute upper respiratory infection, unspecified: Secondary | ICD-10-CM | POA: Diagnosis not present

## 2017-10-03 DIAGNOSIS — J45909 Unspecified asthma, uncomplicated: Secondary | ICD-10-CM | POA: Diagnosis not present

## 2017-10-05 DIAGNOSIS — J209 Acute bronchitis, unspecified: Secondary | ICD-10-CM | POA: Diagnosis not present

## 2018-04-19 DIAGNOSIS — Z0000 Encounter for general adult medical examination without abnormal findings: Secondary | ICD-10-CM | POA: Diagnosis not present

## 2018-04-19 DIAGNOSIS — Z136 Encounter for screening for cardiovascular disorders: Secondary | ICD-10-CM | POA: Diagnosis not present

## 2020-01-15 ENCOUNTER — Ambulatory Visit
Admission: RE | Admit: 2020-01-15 | Discharge: 2020-01-15 | Disposition: A | Discharge status: Home / Self Care | Payer: 59 | Source: Ambulatory Visit | Attending: Family Medicine | Admitting: Family Medicine

## 2020-01-15 ENCOUNTER — Other Ambulatory Visit: Payer: Self-pay | Admitting: Family Medicine

## 2020-01-15 DIAGNOSIS — R600 Localized edema: Secondary | ICD-10-CM

## 2020-02-15 ENCOUNTER — Other Ambulatory Visit: Payer: Self-pay | Admitting: Family Medicine

## 2020-02-15 ENCOUNTER — Ambulatory Visit
Admission: RE | Admit: 2020-02-15 | Discharge: 2020-02-15 | Disposition: A | Discharge status: Home / Self Care | Payer: 59 | Source: Ambulatory Visit | Attending: Family Medicine | Admitting: Family Medicine

## 2020-02-15 DIAGNOSIS — R9389 Abnormal findings on diagnostic imaging of other specified body structures: Secondary | ICD-10-CM

## 2021-11-18 IMAGING — DX DG CHEST 2V
2 series · 2 of 2 positions shown · non-contrast
Comparison: March 23, 2016

CLINICAL DATA: Lower leg edema for 1 week

EXAM:
CHEST - 2 VIEW

[dg chest 2 view (1 of 2)]
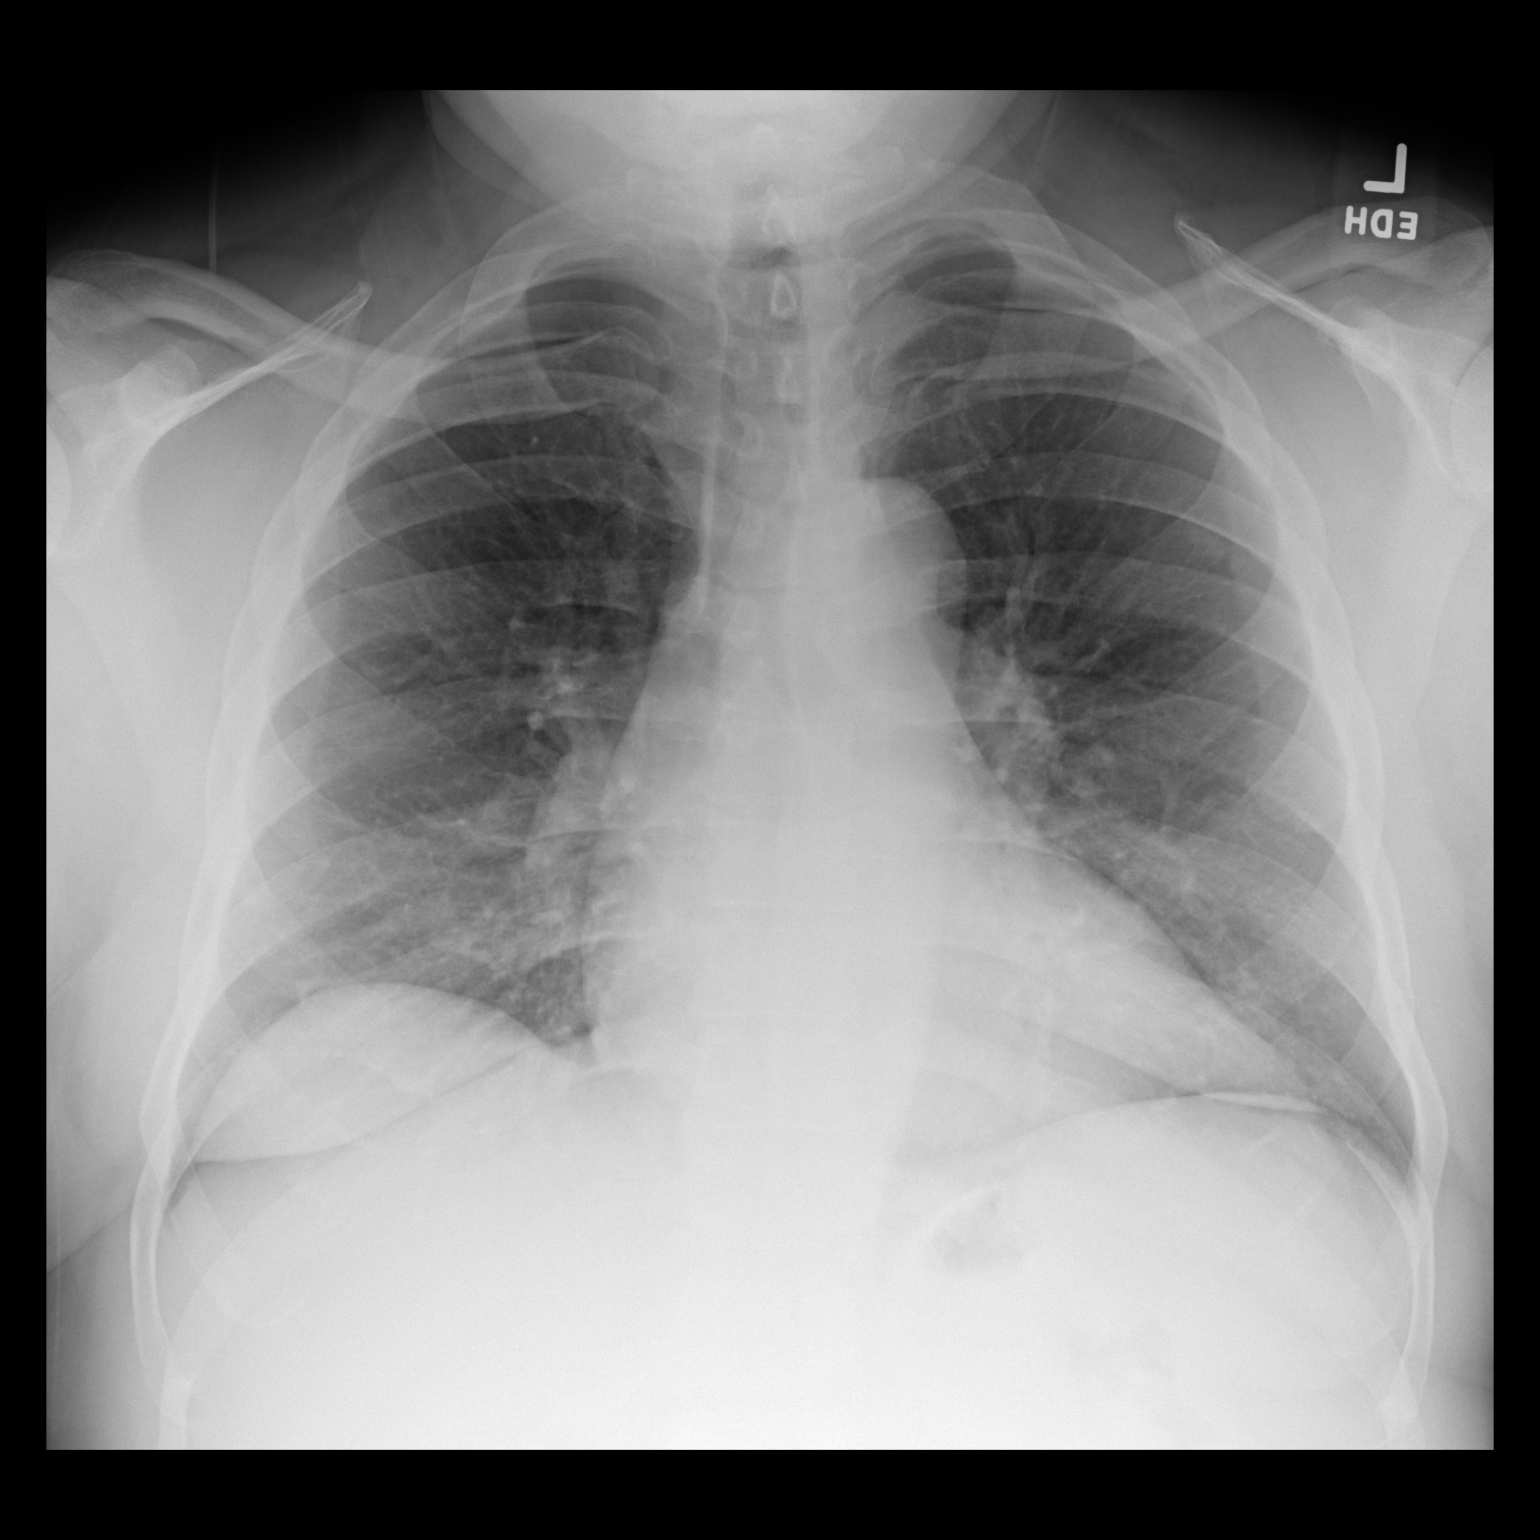

[dg chest 2 view (2 of 2)]
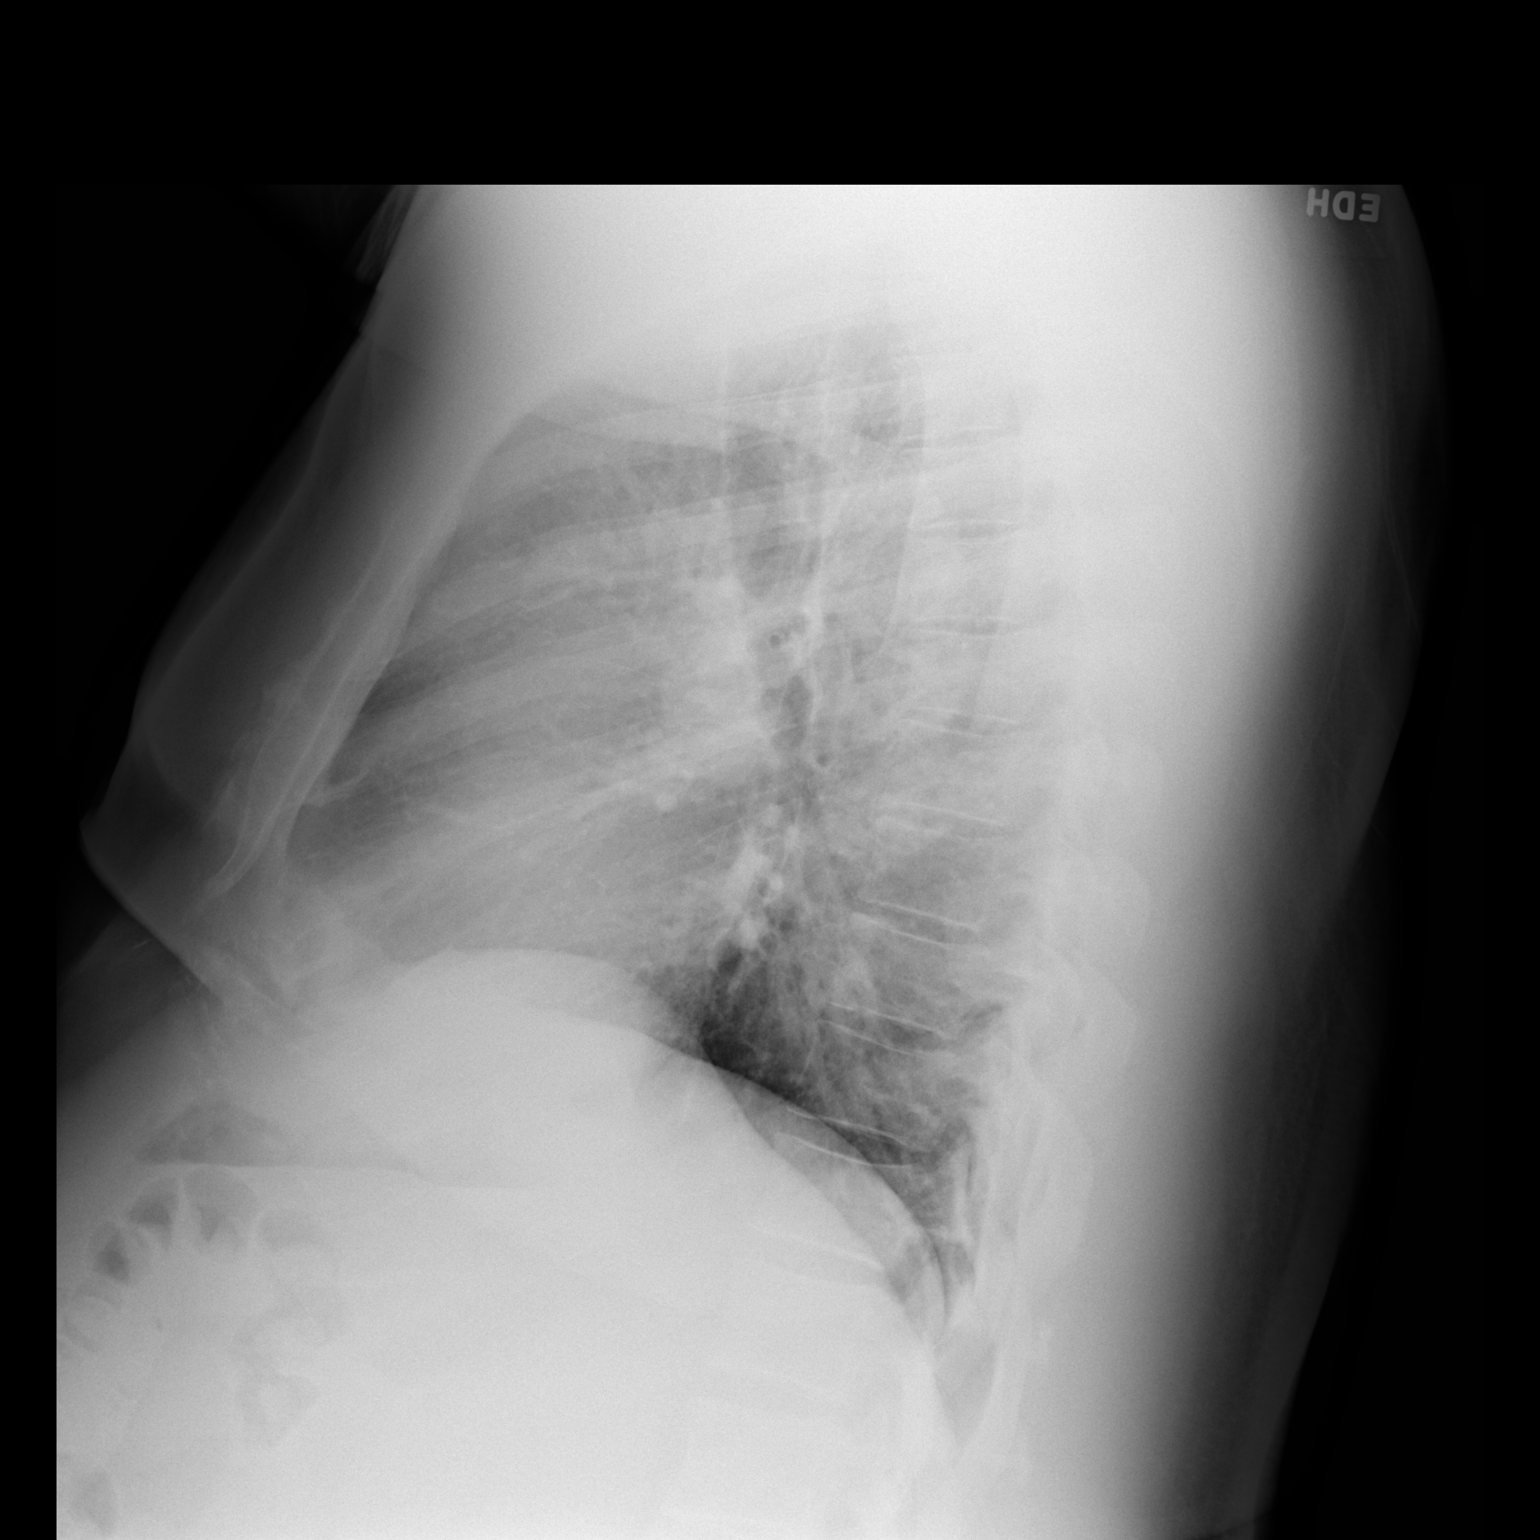

[2 of 2 positions shown; findings below may reference images not displayed]

FINDINGS: Trachea is midline. Cardiomediastinal contours and hilar structures
are normal.

Subtle basilar opacity in the RIGHT lower lobe with some added
density over the spine on the lateral view. No sign of effusion.

Skeletal structures are unremarkable on limited assessment.
IMPRESSION: Subtle RIGHT basilar opacity may represent atelectasis or developing
infection. Consider follow-up to ensure resolution.

## 2021-12-19 IMAGING — CR DG CHEST 2V
2 series · 2 of 2 positions shown · non-contrast
Comparison: 01/15/2020 chest radiograph.

CLINICAL DATA: Follow-up abnormal chest radiograph

EXAM:
CHEST - 2 VIEW

[w chest pa]
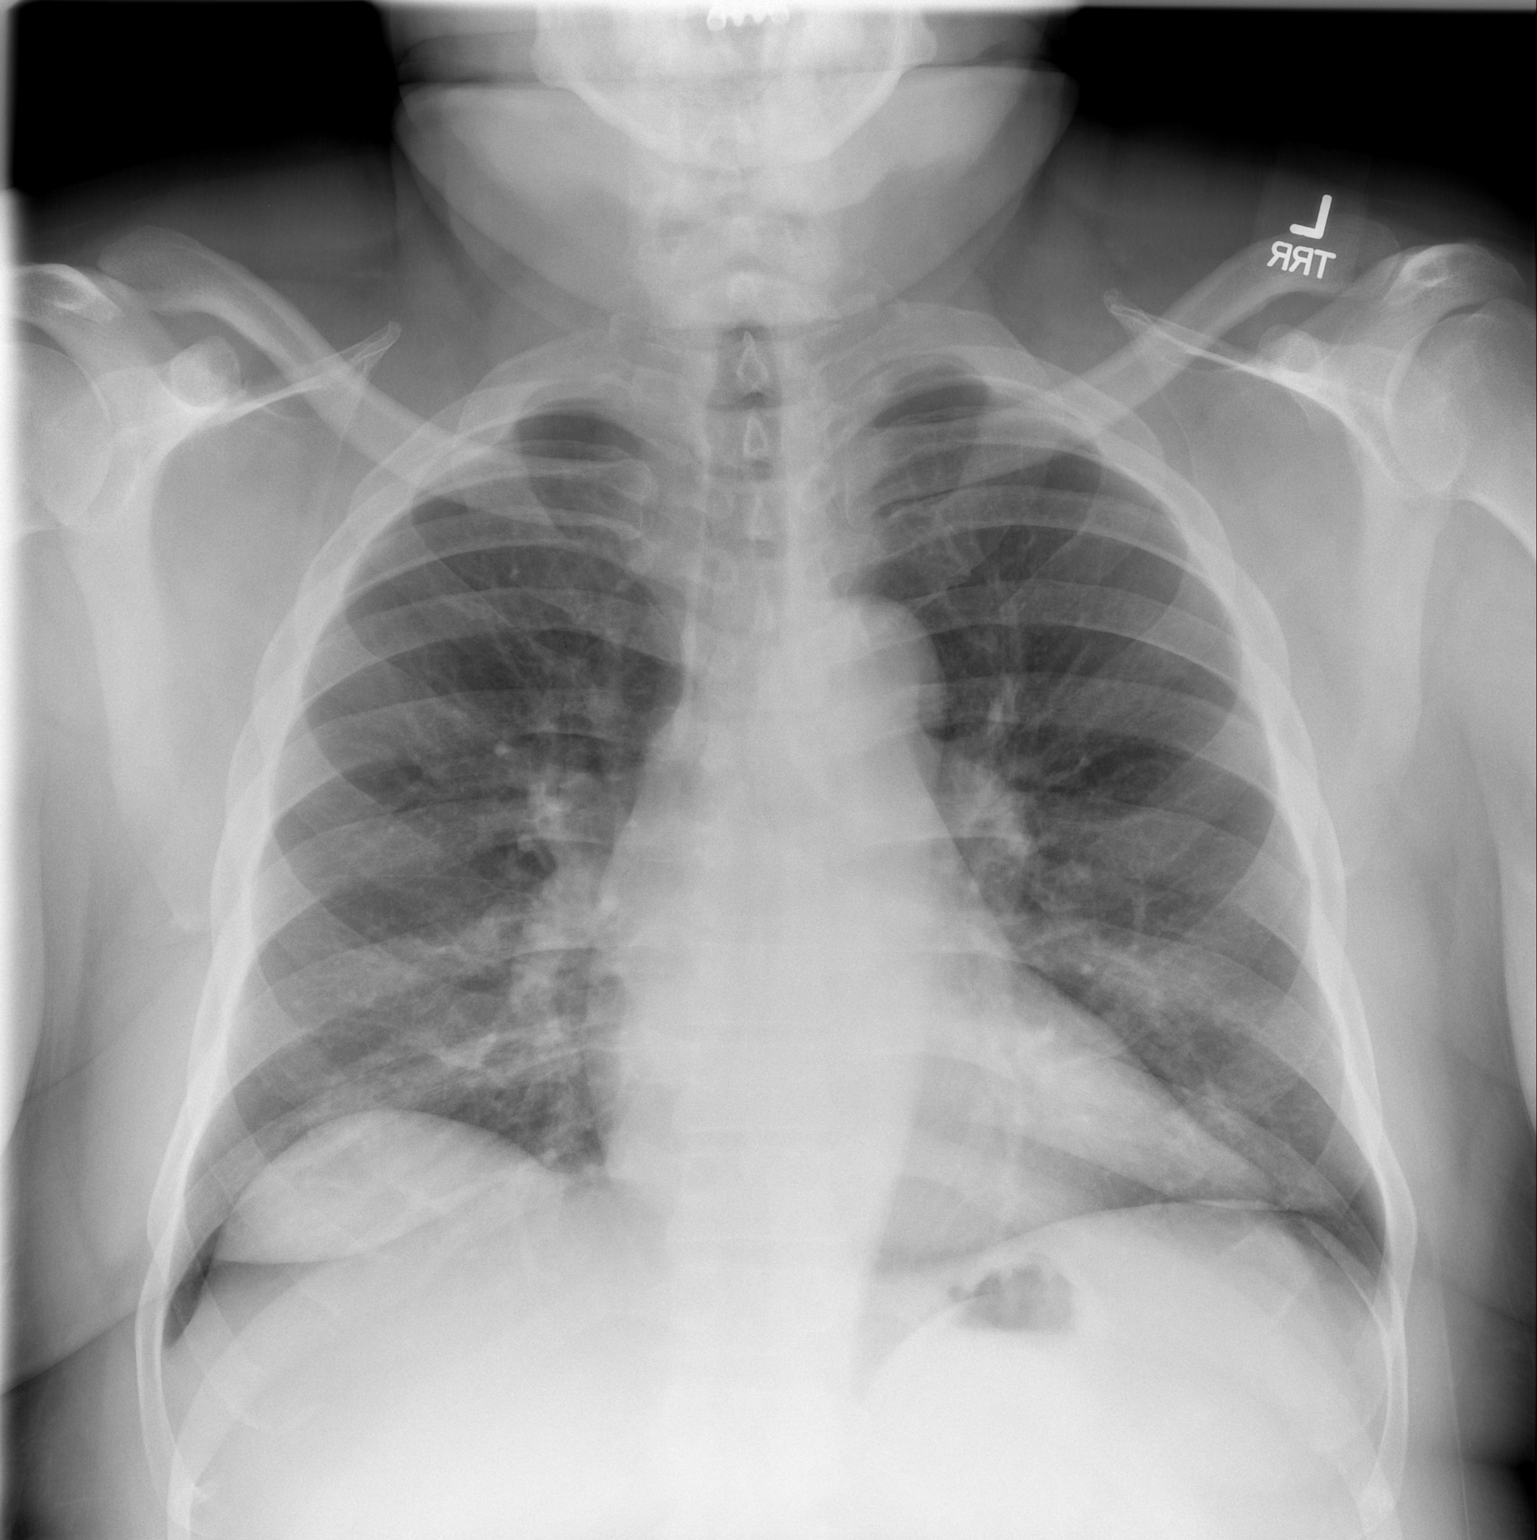

[w chest lat]
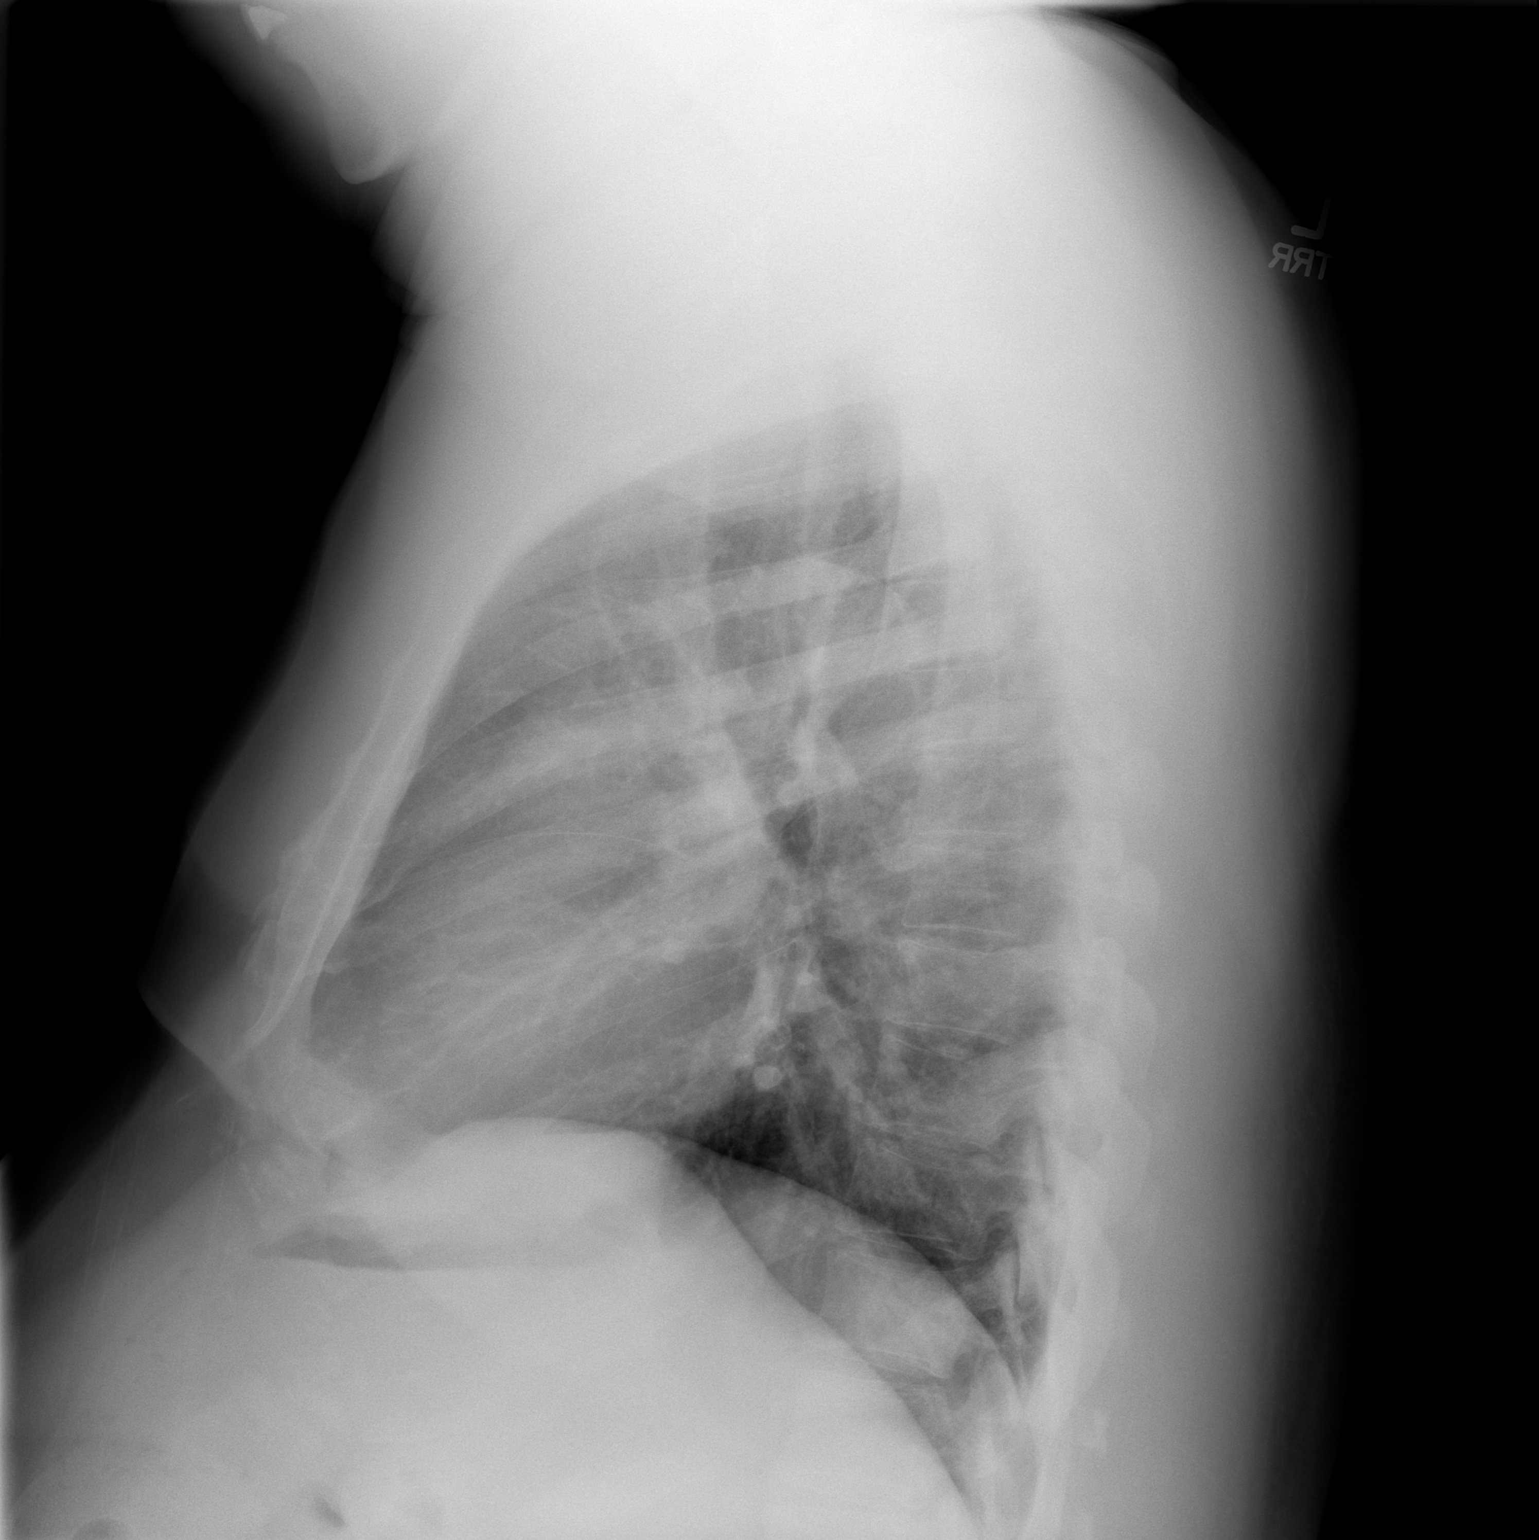

[2 of 2 positions shown; findings below may reference images not displayed]

FINDINGS: Stable cardiomediastinal silhouette with normal heart size. No
pneumothorax. No pleural effusion. Lungs appear clear, with no acute
consolidative airspace disease and no pulmonary edema. Previously
questioned subtle basilar right lung opacity is not appreciated on
today's radiographs.
IMPRESSION: No active cardiopulmonary disease. Previously questioned subtle
basilar right lung opacity is not appreciated on today's
radiographs.
